# Patient Record
Sex: Male | Born: 1960 | Race: White | Hispanic: Yes | Smoking: Never smoker
Health system: Southern US, Community
[De-identification: ages and names within clinical notes are randomized; demographics above are authoritative.]

## PROBLEM LIST (undated history)

## (undated) HISTORY — PX: OTHER SURGICAL HISTORY: SHX169

## (undated) HISTORY — PX: EYE MUSCLE SURGERY: SHX370

---

## 2001-06-11 ENCOUNTER — Emergency Department (HOSPITAL_COMMUNITY): Admission: EM | Admit: 2001-06-11 | Discharge: 2001-06-11 | Payer: Self-pay | Admitting: Emergency Medicine

## 2001-06-17 ENCOUNTER — Emergency Department (HOSPITAL_COMMUNITY): Admission: EM | Admit: 2001-06-17 | Discharge: 2001-06-17 | Payer: Self-pay

## 2005-08-08 ENCOUNTER — Encounter: Admission: RE | Admit: 2005-08-08 | Discharge: 2005-08-08 | Payer: Self-pay | Admitting: Orthopedic Surgery

## 2006-01-03 ENCOUNTER — Emergency Department (HOSPITAL_COMMUNITY): Admission: EM | Admit: 2006-01-03 | Discharge: 2006-01-03 | Payer: Self-pay | Admitting: Emergency Medicine

## 2007-08-31 ENCOUNTER — Emergency Department (HOSPITAL_COMMUNITY): Admission: EM | Admit: 2007-08-31 | Discharge: 2007-08-31 | Payer: Self-pay | Admitting: Emergency Medicine

## 2015-07-02 ENCOUNTER — Encounter (HOSPITAL_COMMUNITY): Payer: Self-pay | Admitting: Vascular Surgery

## 2015-07-02 DIAGNOSIS — K5900 Constipation, unspecified: Secondary | ICD-10-CM | POA: Insufficient documentation

## 2015-07-02 DIAGNOSIS — R110 Nausea: Secondary | ICD-10-CM | POA: Insufficient documentation

## 2015-07-02 LAB — COMPREHENSIVE METABOLIC PANEL
ALBUMIN: 4.1 g/dL (ref 3.5–5.0)
ALK PHOS: 54 U/L (ref 38–126)
ALT: 96 U/L — ABNORMAL HIGH (ref 17–63)
AST: 95 U/L — AB (ref 15–41)
Anion gap: 10 (ref 5–15)
BILIRUBIN TOTAL: 1.4 mg/dL — AB (ref 0.3–1.2)
BUN: 9 mg/dL (ref 6–20)
CALCIUM: 9.5 mg/dL (ref 8.9–10.3)
CO2: 26 mmol/L (ref 22–32)
CREATININE: 0.81 mg/dL (ref 0.61–1.24)
Chloride: 103 mmol/L (ref 101–111)
GFR calc Af Amer: >60 mL/min (ref >60)
GFR calc non Af Amer: >60 mL/min (ref >60)
GLUCOSE: 117 mg/dL — AB (ref 65–99)
Potassium: 3.6 mmol/L (ref 3.5–5.1)
Sodium: 139 mmol/L (ref 135–145)
TOTAL PROTEIN: 6.8 g/dL (ref 6.5–8.1)

## 2015-07-02 LAB — CBC
HCT: 44 % (ref 39.0–52.0)
Hemoglobin: 15.2 g/dL (ref 13.0–17.0)
MCH: 30.3 pg (ref 26.0–34.0)
MCHC: 34.5 g/dL (ref 30.0–36.0)
MCV: 87.6 fL (ref 78.0–100.0)
Platelets: 202 10*3/uL (ref 150–400)
RBC: 5.02 MIL/uL (ref 4.22–5.81)
RDW: 12.5 % (ref 11.5–15.5)
WBC: 7.4 10*3/uL (ref 4.0–10.5)

## 2015-07-02 LAB — TYPE AND SCREEN
ABO/RH(D): B POS
ANTIBODY SCREEN: NEGATIVE

## 2015-07-02 LAB — ABO/RH: ABO/RH(D): B POS

## 2015-07-02 NOTE — ED Notes (Signed)
Pt reports to the ED for eval of abd pain and constipation. Pt reports he has had the abd pain x 3 week. He had a BM today but reports it was small, hard, and black. Pt reports nausea but denies any active vomiting. Pt A&Ox4, resp e/u, and skin warm and dry.

## 2015-07-03 ENCOUNTER — Emergency Department (HOSPITAL_COMMUNITY): Payer: Self-pay

## 2015-07-03 ENCOUNTER — Emergency Department (HOSPITAL_COMMUNITY)
Admission: EM | Admit: 2015-07-03 | Discharge: 2015-07-03 | Disposition: A | Discharge status: Home / Self Care | Payer: Self-pay | Attending: Emergency Medicine | Admitting: Emergency Medicine

## 2015-07-03 ENCOUNTER — Encounter (HOSPITAL_COMMUNITY): Payer: Self-pay | Admitting: Radiology

## 2015-07-03 DIAGNOSIS — K5900 Constipation, unspecified: Secondary | ICD-10-CM

## 2015-07-03 DIAGNOSIS — R109 Unspecified abdominal pain: Secondary | ICD-10-CM

## 2015-07-03 LAB — URINALYSIS, ROUTINE W REFLEX MICROSCOPIC
BILIRUBIN URINE: NEGATIVE
Glucose, UA: NEGATIVE mg/dL
Hgb urine dipstick: NEGATIVE
Ketones, ur: NEGATIVE mg/dL
LEUKOCYTES UA: NEGATIVE
NITRITE: NEGATIVE
PH: 6 (ref 5.0–8.0)
Protein, ur: NEGATIVE mg/dL
SPECIFIC GRAVITY, URINE: 1.004 — AB (ref 1.005–1.030)

## 2015-07-03 MED ORDER — IOHEXOL 300 MG/ML  SOLN
80.0000 mL | Freq: Once | INTRAMUSCULAR | Status: AC | PRN
  Administered 2015-07-03: 100 mL via INTRAVENOUS

## 2015-07-03 MED ORDER — SENNOSIDES-DOCUSATE SODIUM 8.6-50 MG PO TABS: 2.0000 | ORAL_TABLET | Freq: Two times a day (BID) | ORAL | Status: DC

## 2015-07-03 MED ORDER — MORPHINE SULFATE (PF) 4 MG/ML IV SOLN
4.0000 mg | Freq: Once | INTRAVENOUS | Status: AC
  Administered 2015-07-03: 4 mg via INTRAVENOUS
  Filled 2015-07-03: qty 1

## 2015-07-03 MED ORDER — SODIUM CHLORIDE 0.9 % IV BOLUS (SEPSIS)
1000.0000 mL | Freq: Once | INTRAVENOUS | Status: AC
  Administered 2015-07-03: 1000 mL via INTRAVENOUS

## 2015-07-03 NOTE — ED Provider Notes (Addendum)
CSN: [id number]     Arrival date & time 07/02/15  2038 History  By signing my name below, I, Brent Horne, attest that this documentation has been prepared under the direction and in the presence of Azalia BilisKevin Aiyden Lauderback, MD. Electronically Signed: Doreatha MartinEva Horne, ED Scribe. 07/03/2015. 4:06 AM.    Chief Complaint  Patient presents with  . Abdominal Pain   The history is provided by the patient. A language interpreter was used.   HPI Comments: Burke KeelsDamian Horne is a 55 y.o. male who presents to the Emergency Department complaining of moderate lower abdominal pain onset 3 weeks ago with associated nausea, constipation, abdominal distension. He denies emesis.  Reports she's tried twice a day MiraLAX without improvement in his symptoms.  No prior history of colonoscopy.  He denies blood or black colored stool.  No fevers or chills.  History reviewed. No pertinent past medical history. Past Surgical History  Procedure Laterality Date  . Arm surgery    . Eye muscle surgery     No family history on file. Social History  Substance Use Topics  . Smoking status: Never Smoker   . Smokeless tobacco: Never Used  . Alcohol Use: Yes     Comment: weekly    Review of Systems A complete 10 system review of systems was obtained and all systems are negative except as noted in the HPI and PMH.    Allergies  Review of patient's allergies indicates not on file.  Home Medications   Prior to Admission medications   Not on File   BP 102/69 mmHg  Pulse 65  Temp(Src) 97.7 F (36.5 C) (Oral)  Resp 18  SpO2 100% Physical Exam  Constitutional: He is oriented to person, place, and time. He appears well-developed and well-nourished.  HENT:  Head: Normocephalic and atraumatic.  Eyes: EOM are normal.  Neck: Normal range of motion.  Cardiovascular: Normal rate, regular rhythm and normal heart sounds.   Pulmonary/Chest: Effort normal and breath sounds normal. No respiratory distress.  Abdominal: Soft. He  exhibits no distension. There is no tenderness.  Musculoskeletal: Normal range of motion.  Neurological: He is alert and oriented to person, place, and time.  Skin: Skin is warm and dry.  Psychiatric: He has a normal mood and affect. Judgment normal.  Nursing note and vitals reviewed.   ED Course  Procedures (including critical care time) DIAGNOSTIC STUDIES: Oxygen Saturation is 100% on RA, normal by my interpretation.    COORDINATION OF CARE: 1:59 AM Discussed treatment plan with pt at bedside which includes lab work and pt agreed to plan.   Labs Review Labs Reviewed  COMPREHENSIVE METABOLIC PANEL - Abnormal; Notable for the following:    Glucose, Bld 117 (*)    AST 95 (*)    ALT 96 (*)    Total Bilirubin 1.4 (*)    All other components within normal limits  URINALYSIS, ROUTINE W REFLEX MICROSCOPIC (NOT AT [facility]) - Abnormal; Notable for the following:    Specific Gravity, Urine 1.004 (*)    All other components within normal limits  CBC  TYPE AND SCREEN  ABO/RH    Imaging Review Ct Abdomen Pelvis W Contrast  07/03/2015  CLINICAL DATA:  Periumbilical abdominal pain and nausea EXAM: CT ABDOMEN AND PELVIS WITH CONTRAST TECHNIQUE: Multidetector CT imaging of the abdomen and pelvis was performed using the standard protocol following bolus administration of intravenous contrast. CONTRAST:  100mL OMNIPAQUE IOHEXOL 300 MG/ML  SOLN COMPARISON:  12/13/2012 FINDINGS: Lower chest and abdominal  wall: Chronic small fatty umbilical hernia 14 mm nodular density along the right lower lobe tracheal bronchial tree, likely adenopathy but incompletely visualized and volume averaging of vessel cannot be excluded. Hepatobiliary: No focal liver abnormality.No evidence of biliary obstruction or stone. Pancreas: Unremarkable. Spleen: Unremarkable. Adrenals/Urinary Tract: Negative adrenals. No hydronephrosis or stone. Unremarkable bladder. Reproductive:No pathologic findings. Stomach/Bowel: Stool distends  most colonic segments. No bowel obstruction or impaction. No appendicitis. Vascular/Lymphatic: No acute vascular abnormality. No mass or adenopathy. Peritoneal: No ascites or pneumoperitoneum. Musculoskeletal: No acute abnormalities. IMPRESSION: 1. Constipation without obstruction or impaction. 2. Probable right lower lobe bronchial adenopathy, certainty limited by partial visualization. Recommend nonemergent chest CT follow up. Electronically Signed   By: Marnee Spring M.D.   On: 07/03/2015 03:24   I have personally reviewed and evaluated these images and lab results as part of my medical decision-making.   MDM   Final diagnoses:  Abdominal pain, unspecified abdominal location  Constipation, unspecified constipation type    Postvoid residual is normal.  Patient be placed on Senokot in addition to MiraLAX.  Of encouraged high fiber diet and ongoing hydration with water  I personally performed the services described in this documentation, which was scribed in my presence. The recorded information has been reviewed and is accurate.  Patient was told that he will need follow-up CT scan of his chest to further evaluate the abnormal right lower lobe bronchial adenopathy.  This information was transferred to him with use of the interpreter     Azalia Bilis, MD 07/04/15 1478  Azalia Bilis, MD 07/04/15 [phone]

## 2015-07-03 NOTE — Discharge Instructions (Signed)
Estreimiento - Adultos (Constipation, Adult) Estreimiento significa que una persona tiene menos de tres evacuaciones en una semana, dificultad para defecar, o que las heces son secas, duras, o ms grandes que lo normal. A medida que envejecemos el estreimiento es ms comn. Una dieta baja en fibra, no tomar suficientes lquidos y el uso de ciertos medicamentos pueden empeorar el estreimiento.  CAUSAS   Ciertos medicamentos, como los antidepresivos, analgsicos, suplementos de hierro, anticidos y diurticos.  Algunas enfermedades, como la diabetes, el sndrome del colon irritable, enfermedad de la tiroides, o depresin.  No beber suficiente agua.  No consumir suficientes alimentos ricos en fibra.  Situaciones de estrs o viajes.  Falta de actividad fsica o de ejercicio.  Ignorar la necesidad sbita de defecar.  Uso en exceso de laxantes. SIGNOS Y SNTOMAS   Defecar menos de tres veces por semana.  Dificultad para defecar.  Tener las heces secas y duras, o ms grandes que las normales.  Sensacin de estar lleno o hinchado.  Dolor en la parte baja del abdomen.  No sentir alivio despus de defecar. DIAGNSTICO  El mdico le har una historia clnica y un examen fsico. Pueden hacerle exmenes adicionales para el estreimiento grave. Estos estudios pueden ser:  Un radiografa con enema de bario para examinar el recto, el colon y, en algunos casos, el intestino delgado.  Una sigmoidoscopia para examinar el colon inferior.  Una colonoscopia para examinar todo el colon. TRATAMIENTO  El tratamiento depender de la gravedad del estreimiento y de la causa. Algunos tratamientos nutricionales son beber ms lquidos y comer ms alimentos ricos en fibra. El cambio en el estilo de vida incluye hacer ejercicios de manera regular. Si estas recomendaciones para realizar cambios en la dieta y en el estilo de vida no ayudan, el mdico le puede indicar el uso de laxantes de venta libre  para ayudarlo a defecar. Los medicamentos recetados se pueden prescribir si los medicamentos de venta libre no lo ayudan.  INSTRUCCIONES PARA EL CUIDADO EN EL HOGAR   Consuma alimentos con alto contenido de fibra, como frutas, vegetales, cereales integrales y porotos.  Limite los alimentos procesados ricos en grasas y azcar, como las papas fritas, hamburguesas, galletas, dulces y refrescos.  Puede agregar un suplemento de fibra a su dieta si no obtiene lo suficiente de los alimentos.  Beba suficiente lquido para mantener la orina clara o de color amarillo plido.  Haga ejercicio regularmente o segn las indicaciones del mdico.  Vaya al bao cuando sienta la necesidad de ir. No se aguante las ganas.  Tome solo medicamentos de venta libre o recetados, segn las indicaciones del mdico. No tome otros medicamentos para el estreimiento sin consultarlo antes con su mdico. SOLICITE ATENCIN MDICA DE INMEDIATO SI:   Observa sangre brillante en las heces.  El estreimiento dura ms de 4 das o empeora.  Siente dolor abdominal o rectal.  Las heces son delgadas como un lpiz.  Pierde peso de manera inexplicable. ASEGRESE DE QUE:   Comprende estas instrucciones.  Controlar su afeccin.  Recibir ayuda de inmediato si no mejora o si empeora.   Esta informacin no tiene como fin reemplazar el consejo del mdico. Asegrese de hacerle al mdico cualquier pregunta que tenga.   Document Released: 04/30/2007 Document Revised: 05/01/2014 Elsevier Interactive Patient Education 2016 [employer]  

## 2015-07-03 NOTE — ED Notes (Signed)
Given Spanish Education on high fiber foods.

## 2015-07-03 NOTE — ED Notes (Signed)
Patient transported to CT scan . 

## 2016-02-21 ENCOUNTER — Encounter (HOSPITAL_COMMUNITY): Payer: Self-pay | Admitting: *Deleted

## 2016-02-21 DIAGNOSIS — R39198 Other difficulties with micturition: Secondary | ICD-10-CM | POA: Insufficient documentation

## 2016-02-21 DIAGNOSIS — K5900 Constipation, unspecified: Secondary | ICD-10-CM | POA: Insufficient documentation

## 2016-02-21 DIAGNOSIS — R1030 Lower abdominal pain, unspecified: Secondary | ICD-10-CM | POA: Diagnosis present

## 2016-02-21 LAB — CBC
HCT: 43.6 % (ref 39.0–52.0)
Hemoglobin: 15.4 g/dL (ref 13.0–17.0)
MCH: 31 pg (ref 26.0–34.0)
MCHC: 35.3 g/dL (ref 30.0–36.0)
MCV: 87.9 fL (ref 78.0–100.0)
PLATELETS: 222 10*3/uL (ref 150–400)
RBC: 4.96 MIL/uL (ref 4.22–5.81)
RDW: 12.2 % (ref 11.5–15.5)
WBC: 11 10*3/uL — ABNORMAL HIGH (ref 4.0–10.5)

## 2016-02-21 LAB — URINALYSIS, ROUTINE W REFLEX MICROSCOPIC
BILIRUBIN URINE: NEGATIVE
Glucose, UA: NEGATIVE mg/dL
KETONES UR: NEGATIVE mg/dL
Leukocytes, UA: NEGATIVE
Nitrite: NEGATIVE
PROTEIN: NEGATIVE mg/dL
Specific Gravity, Urine: 1.018 (ref 1.005–1.030)
pH: 6 (ref 5.0–8.0)

## 2016-02-21 LAB — COMPREHENSIVE METABOLIC PANEL
ALT: 33 U/L (ref 17–63)
AST: 32 U/L (ref 15–41)
Albumin: 4.5 g/dL (ref 3.5–5.0)
Alkaline Phosphatase: 54 U/L (ref 38–126)
Anion gap: 7 (ref 5–15)
BUN: 17 mg/dL (ref 6–20)
CALCIUM: 9.6 mg/dL (ref 8.9–10.3)
CO2: 26 mmol/L (ref 22–32)
CREATININE: 0.97 mg/dL (ref 0.61–1.24)
Chloride: 105 mmol/L (ref 101–111)
Glucose, Bld: 109 mg/dL — ABNORMAL HIGH (ref 65–99)
Potassium: 3.9 mmol/L (ref 3.5–5.1)
Sodium: 138 mmol/L (ref 135–145)
Total Bilirubin: 1 mg/dL (ref 0.3–1.2)
Total Protein: 7.5 g/dL (ref 6.5–8.1)

## 2016-02-21 LAB — URINE MICROSCOPIC-ADD ON
SQUAMOUS EPITHELIAL / LPF: NONE SEEN
WBC UA: NONE SEEN WBC/hpf (ref 0–5)

## 2016-02-21 LAB — LIPASE, BLOOD: Lipase: 30 U/L (ref 11–51)

## 2016-02-21 NOTE — ED Triage Notes (Signed)
Pt states lower back pain, lower abdominal pain and difficulty urinating x 2 weeks, accompanied by nausea.  Interpreter services used.

## 2016-02-21 NOTE — ED Notes (Signed)
Interpreter services used

## 2016-02-22 ENCOUNTER — Emergency Department (HOSPITAL_COMMUNITY): Payer: No Typology Code available for payment source

## 2016-02-22 ENCOUNTER — Emergency Department (HOSPITAL_COMMUNITY)
Admission: EM | Admit: 2016-02-22 | Discharge: 2016-02-22 | Disposition: A | Discharge status: Home / Self Care | Payer: No Typology Code available for payment source | Attending: Emergency Medicine | Admitting: Emergency Medicine

## 2016-02-22 DIAGNOSIS — R39198 Other difficulties with micturition: Secondary | ICD-10-CM

## 2016-02-22 DIAGNOSIS — R1084 Generalized abdominal pain: Secondary | ICD-10-CM

## 2016-02-22 DIAGNOSIS — K5900 Constipation, unspecified: Secondary | ICD-10-CM

## 2016-02-22 MED ORDER — TAMSULOSIN HCL 0.4 MG PO CAPS: 0.4000 mg | ORAL_CAPSULE | Freq: Every day | ORAL | 0 refills | Status: DC

## 2016-02-22 MED ORDER — POLYETHYLENE GLYCOL 3350 17 G PO PACK: 17.0000 g | PACK | Freq: Two times a day (BID) | ORAL | 0 refills | Status: DC

## 2016-02-22 MED ORDER — IOPAMIDOL (ISOVUE-370) INJECTION 76%
INTRAVENOUS | Status: AC
  Administered 2016-02-22: 100 mL
  Filled 2016-02-22: qty 100

## 2016-02-22 MED ORDER — MORPHINE SULFATE (PF) 4 MG/ML IV SOLN
4.0000 mg | Freq: Once | INTRAVENOUS | Status: AC
  Administered 2016-02-22: 4 mg via INTRAVENOUS
  Filled 2016-02-22: qty 1

## 2016-02-22 NOTE — ED Notes (Signed)
Pt given saltines and ginger ale. 

## 2016-02-22 NOTE — ED Notes (Signed)
Pt returned from CT °

## 2016-02-22 NOTE — Discharge Instructions (Signed)
You were seen today for abdominal pain.  Your work-up is reassuring.  YOu will be sent home with miralax for constipation and flomax for difficulty urinating.

## 2016-02-22 NOTE — ED Provider Notes (Signed)
[facility]-EMERGENCY DEPT Provider Note   CSN: [id number] Arrival date & time: 02/21/16  1631   By signing my name below, I, Brent Horne, attest that this documentation has been prepared under the direction and in the presence of Brent Batonourtney F Vi Whitesel, MD  Electronically Signed: Clovis PuAvnee Horne, ED Scribe. 02/22/16. 1:18 AM.   History   Chief Complaint Chief Complaint  Patient presents with  . Back Pain  . Abdominal Pain   The history is provided by the patient. A language interpreter was used.   HPI Comments:  Brent Horne is a 55 y.o. male who presents to the Emergency Department complaining of worsening "7//10"  lower abdominal pain x 2 weeks. No exacerbating or alleviating factors noted. Associated symptoms include lower back pain x today. He also notes constipation and states his last normal bowel movement was 2 weeks ago. Pt took unknown medication in an orange bottle for constipation with no relief. Was difficult to urinating. Denies dysuria or hematuria. Pt denies weakness to BLE,/BUE and nausea. He also denies a hx of heart disease, HTN, DM, and taking daily medications. Pt denies any other symptoms or complaints at this time.  History reviewed. No pertinent past medical history.  There are no active problems to display for this patient.   Past Surgical History:  Procedure Laterality Date  . arm surgery    . EYE MUSCLE SURGERY      Home Medications    Prior to Admission medications   Medication Sig Start Date End Date Taking? Authorizing Provider  indomethacin (INDOCIN) 25 MG capsule Take 25 mg by mouth 3 (three) times daily as needed for mild pain.   Yes Historical Provider, MD  polyethylene glycol (MIRALAX / GLYCOLAX) packet Take 17 g by mouth 2 (two) times daily. 02/22/16   Brent Batonourtney F Brent Polidori, MD  tamsulosin (FLOMAX) 0.4 MG CAPS capsule Take 1 capsule (0.4 mg total) by mouth daily. 02/22/16   Brent Batonourtney F Brent Dimperio, MD    Family History No family history on  file.  Social History Social History  Substance Use Topics  . Smoking status: Never Smoker  . Smokeless tobacco: Never Used  . Alcohol use Yes     Comment: weekly     Allergies   Review of patient's allergies indicates no known allergies.   Review of Systems Review of Systems  Constitutional: Negative for fever.  Respiratory: Negative for shortness of breath.   Cardiovascular: Negative for chest pain.  Gastrointestinal: Positive for abdominal pain and constipation. Negative for nausea.  Musculoskeletal: Positive for back pain.  Neurological: Negative for weakness.  All other systems reviewed and are negative.    Physical Exam Updated Vital Signs BP 120/80   Pulse 75   Temp 98.5 F (36.9 C) (Oral)   Resp 16   Ht 5\' 8"  (1.727 m)   Wt 150 lb (68 kg)   SpO2 98%   BMI 22.81 kg/m   Physical Exam  Constitutional: He is oriented to person, place, and time. He appears well-developed and well-nourished. No distress.  HENT:  Head: Normocephalic and atraumatic.  Cardiovascular: Normal rate, regular rhythm and normal heart sounds.   No murmur heard. Pulmonary/Chest: Effort normal and breath sounds normal. No respiratory distress. He has no wheezes.  Abdominal: Soft. Bowel sounds are normal. There is tenderness. There is no rebound and no guarding.  Left upper quadrant tenderness on palpation without rebound or guarding  Musculoskeletal: He exhibits no edema.  Neurological: He is alert and oriented to person, place,  and time.  Skin: Skin is warm and dry.  Psychiatric: He has a normal mood and affect.  Nursing note and vitals reviewed.    ED Treatments / Results  DIAGNOSTIC STUDIES:  Oxygen Saturation is 99% on RA, normal by my interpretation.    COORDINATION OF CARE:  1:05 AM Discussed treatment plan with pt at bedside and pt agreed to plan.  Labs (all labs ordered are listed, but only abnormal results are displayed) Labs Reviewed  COMPREHENSIVE METABOLIC PANEL  - Abnormal; Notable for the following:       Result Value   Glucose, Bld 109 (*)    All other components within normal limits  CBC - Abnormal; Notable for the following:    WBC 11.0 (*)    All other components within normal limits  URINALYSIS, ROUTINE W REFLEX MICROSCOPIC (NOT AT [facility]) - Abnormal; Notable for the following:    Hgb urine dipstick TRACE (*)    All other components within normal limits  URINE MICROSCOPIC-ADD ON - Abnormal; Notable for the following:    Bacteria, UA RARE (*)    All other components within normal limits  LIPASE, BLOOD    EKG  EKG Interpretation None       Radiology Ct Angio Abd/pel W And/or Wo Contrast  Result Date: 02/22/2016 CLINICAL DATA:  Subacute onset of lower back and lower abdominal pain. Initial encounter. EXAM: CTA ABDOMEN AND PELVIS WITHOUT AND WITH CONTRAST TECHNIQUE: Multidetector CT imaging of the abdomen and pelvis was performed using the standard protocol during bolus administration of intravenous contrast. Multiplanar reconstructed images and MIPs were obtained and reviewed to evaluate the vascular anatomy. CONTRAST:  100 mL of Isovue 370 IV contrast COMPARISON:  CT of the abdomen and pelvis from 07/03/2015 FINDINGS: VASCULAR Aorta: There is no evidence of aortic dissection. There is no evidence of aneurysmal dilatation. No calcific atherosclerotic disease seen. The abdominal aorta is grossly unremarkable in appearance. Celiac: The celiac trunk remains fully patent. SMA: The superior mesenteric artery is fully patent. Renals: The renal arteries appear fully patent bilaterally. IMA: The inferior mesenteric artery is fully patent. Inflow: The common, external and internal iliac arteries appear fully patent bilaterally. Proximal Outflow: The common femoral arteries and their proximal branches are unremarkable in appearance. Veins: The visualized venous structures are unremarkable. The inferior vena cava is within normal limits. Review of the MIP  images confirms the above findings. NON-VASCULAR Lower chest: Minimal bibasilar atelectasis is noted. Hepatobiliary: The liver is unremarkable in appearance. The gallbladder is unremarkable in appearance. The common bile duct remains normal in caliber. Pancreas: The pancreas is within normal limits. Spleen: The spleen is unremarkable in appearance. Adrenals/Urinary Tract: The adrenal glands are unremarkable in appearance. The kidneys are within normal limits. There is no evidence of hydronephrosis. No renal or ureteral stones are identified. No perinephric stranding is seen. Stomach/Bowel: The stomach is unremarkable in appearance. The small bowel is within normal limits. The appendix is normal in caliber, without evidence of appendicitis. The colon is unremarkable in appearance. Lymphatic: No retroperitoneal lymphadenopathy is seen. No pelvic sidewall lymphadenopathy is identified. Reproductive: The bladder is mildly distended and grossly unremarkable. The prostate remains normal in size. Other: No additional soft tissue abnormalities are seen. Musculoskeletal: No acute osseous abnormalities are identified. The visualized musculature is unremarkable in appearance. IMPRESSION: VASCULAR No evidence of aortic dissection. No evidence of aneurysmal dilatation. No calcific atherosclerotic disease seen. NON-VASCULAR No acute abnormality seen within the abdomen or pelvis. Electronically Signed  By: Roanna RaiderJeffery  Chang M.D.   On: 02/22/2016 02:57    Procedures Procedures (including critical care time)  Medications Ordered in ED Medications  morphine 4 MG/ML injection 4 mg (4 mg Intravenous Given 02/22/16 0131)  iopamidol (ISOVUE-370) 76 % injection (100 mLs  Contrast Given 02/22/16 0202)     Initial Impression / Assessment and Plan / ED Course  I have reviewed the triage vital signs and the nursing notes.  Pertinent labs & imaging results that were available during my care of the patient were reviewed by me and  considered in my medical decision making (see chart for details).  Clinical Course    Patient presents with abdominal pain. Also reports constipation and back pain. Nontoxic. Nonfocal. Vital signs reassuring. Tender without signs of peritonitis. Considerations include constipation, AAA, urinary tract infection. Urinalysis normal. Basic labwork reassuring. CT angiogram the abdomen is negative for AAA. Will treat for constipation with MiraLAX. Will also provide Flomax for difficulty urinating. Follow-up with urology if not improved.  After history, exam, and medical workup I feel the patient has been appropriately medically screened and is safe for discharge home. Pertinent diagnoses were discussed with the patient. Patient was given return precautions.   Final Clinical Impressions(s) / ED Diagnoses   Final diagnoses:  Generalized abdominal pain  Constipation, unspecified constipation type  Difficulty urinating    New Prescriptions New Prescriptions   POLYETHYLENE GLYCOL (MIRALAX / GLYCOLAX) PACKET    Take 17 g by mouth 2 (two) times daily.   TAMSULOSIN (FLOMAX) 0.4 MG CAPS CAPSULE    Take 1 capsule (0.4 mg total) by mouth daily.   I personally performed the services described in this documentation, which was scribed in my presence. The recorded information has been reviewed and is accurate.     Brent Batonourtney F Shuntia Exton, MD 02/22/16 [phone]

## 2017-02-27 ENCOUNTER — Encounter (HOSPITAL_COMMUNITY): Payer: Self-pay | Admitting: Emergency Medicine

## 2017-02-27 ENCOUNTER — Emergency Department (HOSPITAL_COMMUNITY)
Admission: EM | Admit: 2017-02-27 | Discharge: 2017-02-27 | Disposition: A | Discharge status: Home / Self Care | Payer: No Typology Code available for payment source | Attending: Emergency Medicine | Admitting: Emergency Medicine

## 2017-02-27 ENCOUNTER — Emergency Department (HOSPITAL_COMMUNITY): Payer: No Typology Code available for payment source

## 2017-02-27 DIAGNOSIS — N400 Enlarged prostate without lower urinary tract symptoms: Secondary | ICD-10-CM | POA: Diagnosis not present

## 2017-02-27 DIAGNOSIS — R109 Unspecified abdominal pain: Secondary | ICD-10-CM | POA: Diagnosis present

## 2017-02-27 DIAGNOSIS — K700 Alcoholic fatty liver: Secondary | ICD-10-CM | POA: Insufficient documentation

## 2017-02-27 DIAGNOSIS — K760 Fatty (change of) liver, not elsewhere classified: Secondary | ICD-10-CM

## 2017-02-27 DIAGNOSIS — Z79899 Other long term (current) drug therapy: Secondary | ICD-10-CM | POA: Insufficient documentation

## 2017-02-27 LAB — URINALYSIS, ROUTINE W REFLEX MICROSCOPIC
Bacteria, UA: NONE SEEN
Bilirubin Urine: NEGATIVE
Glucose, UA: NEGATIVE mg/dL
Ketones, ur: 5 mg/dL — AB
Leukocytes, UA: NEGATIVE
Nitrite: NEGATIVE
Protein, ur: 30 mg/dL — AB
Specific Gravity, Urine: 1.016 (ref 1.005–1.030)
Squamous Epithelial / LPF: NONE SEEN
pH: 6 (ref 5.0–8.0)

## 2017-02-27 LAB — COMPREHENSIVE METABOLIC PANEL
ALT: 32 U/L (ref 17–63)
AST: 36 U/L (ref 15–41)
Albumin: 4.2 g/dL (ref 3.5–5.0)
Alkaline Phosphatase: 54 U/L (ref 38–126)
Anion gap: 11 (ref 5–15)
BUN: 16 mg/dL (ref 6–20)
CO2: 24 mmol/L (ref 22–32)
Calcium: 9.7 mg/dL (ref 8.9–10.3)
Chloride: 105 mmol/L (ref 101–111)
Creatinine, Ser: 0.8 mg/dL (ref 0.61–1.24)
GFR calc Af Amer: >60 mL/min (ref >60)
GFR calc non Af Amer: >60 mL/min (ref >60)
Glucose, Bld: 103 mg/dL — ABNORMAL HIGH (ref 65–99)
Potassium: 3.9 mmol/L (ref 3.5–5.1)
Sodium: 140 mmol/L (ref 135–145)
Total Bilirubin: 0.7 mg/dL (ref 0.3–1.2)
Total Protein: 7.6 g/dL (ref 6.5–8.1)

## 2017-02-27 LAB — CBC
HCT: 43.3 % (ref 39.0–52.0)
Hemoglobin: 15.2 g/dL (ref 13.0–17.0)
MCH: 31.3 pg (ref 26.0–34.0)
MCHC: 35.1 g/dL (ref 30.0–36.0)
MCV: 89.1 fL (ref 78.0–100.0)
Platelets: 197 10*3/uL (ref 150–400)
RBC: 4.86 MIL/uL (ref 4.22–5.81)
RDW: 12.7 % (ref 11.5–15.5)
WBC: 8 10*3/uL (ref 4.0–10.5)

## 2017-02-27 LAB — LIPASE, BLOOD: Lipase: 28 U/L (ref 11–51)

## 2017-02-27 MED ORDER — IOPAMIDOL (ISOVUE-300) INJECTION 61%
INTRAVENOUS | Status: AC
  Administered 2017-02-27: 100 mL
  Filled 2017-02-27: qty 100

## 2017-02-27 NOTE — ED Notes (Addendum)
ED Provider at bedside.  Reviewed results and discharge instructions using an interpertor.

## 2017-02-27 NOTE — ED Triage Notes (Signed)
Translator/pt stated, I've had abdominal pain with back pain for over a year with some burning with peeing.Over a year.

## 2017-02-27 NOTE — Discharge Instructions (Signed)
Avoid alcohol. Try maalox or mylanta for stomach pain. Follow up with family doctor for further evaluation and treatment of your enlarged prostate and liver inflammation.

## 2017-02-27 NOTE — ED Provider Notes (Signed)
[facility] EMERGENCY [facility]PARTMENT Provider Note   CSN: [id number] Arrival date & time: 02/27/17  1754     History   Chief Complaint Chief Complaint  Patient presents with  . Abdominal Pain  . Back Pain  . Dysuria    HPI Brent Horne is a 56 y.o. male.  HPI Brent Horne is a 56 y.o. male with no medical problems, presents to emergency department complaining of abdominal pain.  Patient states he has had abdominal pain for approximately 2 weeks, however states it started to get worse in the last 2 days.  He reports associated nausea, no vomiting.  Reports normal bowel movements, last bowel movement was this morning.  Denies any blood in his stool.  States he is eating and drinking normally.  States pain is mainly in the left lower abdomen but radiates all over.  Denies any associated fever or chills.  He does report some burning with urination, however no frequency or urgency.  He has not been taking any medications to help with this.  His pain is worsened with any movement. No hx of the same  Pt is spanish speaking only, interpreter was used.   History reviewed. No pertinent past medical history.  There are no active problems to display for this patient.   Past Surgical History:  Procedure Laterality Date  . arm surgery    . EYE MUSCLE SURGERY         Home Medications    Prior to Admission medications   Medication Sig Start Date End Date Taking? Authorizing Provider  indomethacin (INDOCIN) 25 MG capsule Take 25 mg by mouth 3 (three) times daily as needed for mild pain.    [provider]  polyethylene glycol (MIRALAX / GLYCOLAX) packet Take 17 g by mouth 2 (two) times daily. 02/22/16   Horton, Mayer Maskerourtney F, MD  tamsulosin (FLOMAX) 0.4 MG CAPS capsule Take 1 capsule (0.4 mg total) by mouth daily. 02/22/16   Horton, Mayer Maskerourtney F, MD    Family History No family history on file.  Social History Social History   Tobacco Use  .  Smoking status: Never Smoker  . Smokeless tobacco: Never Used  Substance Use Topics  . Alcohol use: Yes    Comment: weekly  . Drug use: No     Allergies   Patient has no known allergies.   Review of Systems Review of Systems  Constitutional: Negative for chills and fever.  Respiratory: Negative for cough, chest tightness and shortness of breath.   Cardiovascular: Negative for chest pain, palpitations and leg swelling.  Gastrointestinal: Positive for abdominal pain and nausea. Negative for abdominal distention, diarrhea and vomiting.  Genitourinary: Positive for dysuria. Negative for discharge, flank pain, frequency, hematuria, scrotal swelling, testicular pain and urgency.  Musculoskeletal: Negative for arthralgias, myalgias, neck pain and neck stiffness.  Skin: Negative for rash.  Allergic/Immunologic: Negative for immunocompromised state.  Neurological: Negative for dizziness, weakness, light-headedness, numbness and headaches.  All other systems reviewed and are negative.    Physical Exam Updated Vital Signs BP (!) 145/99 (BP Location: Left Arm)   Pulse 77   Temp 97.8 F (36.6 C) (Oral)   Resp 16   Wt 74.8 kg (165 lb)   SpO2 99%   BMI 25.09 kg/m   Physical Exam  Constitutional: He appears well-developed and well-nourished. No distress.  HENT:  Head: Normocephalic and atraumatic.  Eyes: Conjunctivae are normal.  Neck: Neck supple.  Cardiovascular: Normal rate, regular rhythm and normal heart  sounds.  Pulmonary/Chest: Effort normal. No respiratory distress. He has no wheezes. He has no rales.  Abdominal: Soft. Bowel sounds are normal. He exhibits no distension. There is generalized tenderness. There is no rigidity, no rebound and no tenderness at McBurney's point.  Diffuse tenderness, worse in the left lower quadrant.  Some voluntary guarding  Musculoskeletal: He exhibits no edema.  Neurological: He is alert.  Skin: Skin is warm and dry.  Nursing note and vitals  reviewed.    ED Treatments / Results  Labs (all labs ordered are listed, but only abnormal results are displayed) Labs Reviewed  URINALYSIS, ROUTINE W REFLEX MICROSCOPIC - Abnormal; Notable for the following components:      Result Value   Hgb urine dipstick SMALL (*)    Ketones, ur 5 (*)    Protein, ur 30 (*)    All other components within normal limits  CBC  LIPASE, BLOOD  COMPREHENSIVE METABOLIC PANEL    EKG  EKG Interpretation None       Radiology Ct Abdomen Pelvis W Contrast  Result Date: 02/27/2017 CLINICAL DATA:  Abdominal pain and back pain for over a year. Burning with urination. EXAM: CT ABDOMEN AND PELVIS WITH CONTRAST TECHNIQUE: Multidetector CT imaging of the abdomen and pelvis was performed using the standard protocol following bolus administration of intravenous contrast. CONTRAST:  ISOVUE-300 IOPAMIDOL (ISOVUE-300) INJECTION 61% COMPARISON:  02/22/2016 FINDINGS: Lower chest: The lung bases are clear. Hepatobiliary: Mild diffuse fatty infiltration of the liver. No focal lesions identified. Gallbladder and bile ducts are unremarkable. Pancreas: Unremarkable. No pancreatic ductal dilatation or surrounding inflammatory changes. Spleen: Normal in size without focal abnormality. Adrenals/Urinary Tract: Adrenal glands are unremarkable. Kidneys are normal, without renal calculi, focal lesion, or hydronephrosis. Bladder is unremarkable. Stomach/Bowel: Stomach is within normal limits. Appendix appears normal. No evidence of bowel wall thickening, distention, or inflammatory changes. Vascular/Lymphatic: No significant vascular findings are present. No enlarged abdominal or pelvic lymph nodes. Reproductive: Prostate gland is mildly enlarged, measuring 4.3 cm diameter. Prostate calcifications. Other: Minimal periumbilical hernia containing fat. No free air or free fluid in the abdomen. Musculoskeletal: Degenerative changes in the spine. No destructive bone lesions. IMPRESSION:  1. Diffuse fatty infiltration of the liver. 2. Enlarged prostate gland. 3. Minimal periumbilical hernia containing fat. 4. No bowel obstruction or inflammation. Electronically Signed   By: Burman Nieves M.D.   On: 02/27/2017 21:15    Procedures Procedures (including critical care time)  Medications Ordered in ED Medications - No data to display   Initial Impression / Assessment and Plan / ED Course  I have reviewed the triage vital signs and the nursing notes.  Pertinent labs & imaging results that were available during my care of the patient were reviewed by me and considered in my medical decision making (see chart for details).     Patient with left lower quadrant abdominal pain, worse with any movement.  Pretty significant tenderness with some guarding.  Labs pending.  Will get CT abdomen pelvis to rule out diverticulitis. UA pending  UA with small hgb, 30 prot, otherwise unremarkable with no signs of infection. CT showing hepatosteatosis and prostate enlargement. No fever. Normal WBC.  No concern for infectious process. Discussed results via interpreter. Will dc home with close outpatient follow up. Return precautions discussed.   Vitals:   02/27/17 1930 02/27/17 1945 02/27/17 2000 02/27/17 2139  BP: (!) 136/94 120/78 116/77 137/89  Pulse: 70 67 61 67  Resp:    18  Temp:  TempSrc:      SpO2: 100% 98% 97% 100%  Weight:        Final Clinical Impressions(s) / ED Diagnoses   Final diagnoses:  Hepatic steatosis  Prostate enlargement    ED Discharge Orders    None       Jaynie CrumbleKirichenko, Lydiann Bonifas, Cordelia Poche-C 02/27/17 2150    Raeford RazorKohut, Stephen, MD 03/06/17 1230

## 2017-02-27 NOTE — ED Notes (Signed)
Signature pad not working, no questions at this time

## 2017-03-05 ENCOUNTER — Ambulatory Visit (HOSPITAL_COMMUNITY)
Admission: EM | Admit: 2017-03-05 | Discharge: 2017-03-05 | Disposition: A | Discharge status: Home / Self Care | Payer: No Typology Code available for payment source | Attending: Emergency Medicine | Admitting: Emergency Medicine

## 2017-03-05 ENCOUNTER — Encounter (HOSPITAL_COMMUNITY): Payer: Self-pay | Admitting: Emergency Medicine

## 2017-03-05 DIAGNOSIS — R1084 Generalized abdominal pain: Secondary | ICD-10-CM | POA: Diagnosis not present

## 2017-03-05 DIAGNOSIS — N400 Enlarged prostate without lower urinary tract symptoms: Secondary | ICD-10-CM | POA: Diagnosis not present

## 2017-03-05 DIAGNOSIS — K5901 Slow transit constipation: Secondary | ICD-10-CM

## 2017-03-05 DIAGNOSIS — K760 Fatty (change of) liver, not elsewhere classified: Secondary | ICD-10-CM

## 2017-03-05 MED ORDER — TAMSULOSIN HCL 0.4 MG PO CAPS: 0.4000 mg | ORAL_CAPSULE | Freq: Every day | ORAL | 0 refills | Status: AC

## 2017-03-05 MED ORDER — POLYETHYLENE GLYCOL 3350 17 GM/SCOOP PO POWD: ORAL | 0 refills | Status: AC

## 2017-03-05 MED ORDER — CIPROFLOXACIN HCL 500 MG PO TABS: 500.0000 mg | ORAL_TABLET | Freq: Two times a day (BID) | ORAL | 0 refills | Status: AC

## 2017-03-05 NOTE — Discharge Instructions (Signed)
Read these instructions Take the medications as directed. Go to pharmacy to get them Call the stomach specialist for appointment Will also need to see a urologist for the prostate

## 2017-03-05 NOTE — ED Provider Notes (Signed)
[facility]    CSN: [id number] Arrival date & time: 03/05/17  1747     History   Chief Complaint Chief Complaint  Patient presents with  . Abdominal Pain    HPI Brent Horne is a 56 y.o. male.   [name] presents to the urgent care twice with 6 days after he was evaluated in the emergency department for abdominal pain and some urinary symptoms. He had a battery of lab work and CT scans of the abdomen and pelvis. The findings were that of some light in the urine, hepatic steatosis and enlargement of the prostate. There was no obstruction, masses or other abnormal findings.  The patient states that he feels that he is constipated. Although he had a bowel movement yesterday he believes that he has been constipated for several days or weeks. The abdominal pain is actually been chronic for 2 years but worse in the past 2 weeks. He occasionally has nausea but no vomiting. No new symptoms today. This is a continuation of symptoms from the emergency department, same history and complaint.    Below his a copy of emergency department notes for the visit on November 6:  Patient with left lower quadrant abdominal pain, worse with any movement.  Pretty significant tenderness with some guarding.  Labs pending.  Will get CT abdomen pelvis to rule out diverticulitis. UA pending  UA with small hgb, 30 prot, otherwise unremarkable with no signs of infection. CT showing hepatosteatosis and prostate enlargement. No fever. Normal WBC.  No concern for infectious process. Discussed results via interpreter. Will dc home with close outpatient follow up. Return precautions discussed.   Vitals:  02/27/17 1930 02/27/17 1945 02/27/17 2000 02/27/17 2139 BP: (!) 136/94 120/78 116/77 137/89 Pulse: 70 67 61 67 Resp:    18 Temp:     TempSrc:     SpO2: 100% 98% 97% 100%        History reviewed. No pertinent past medical history.  There are no active  problems to display for this patient.   Past Surgical History:  Procedure Laterality Date  . arm surgery    . EYE MUSCLE SURGERY         Home Medications    Prior to Admission medications   Medication Sig Start Date End Date Taking? Authorizing Provider  ciprofloxacin (CIPRO) 500 MG tablet Take 1 tablet (500 mg total) 2 (two) times daily by mouth. 03/05/17   Hayden RasmussenMabe, Quinnten Calvin, NP  indomethacin (INDOCIN) 25 MG capsule Take 25 mg by mouth 3 (three) times daily as needed for mild pain.    [provider]  polyethylene glycol powder (GLYCOLAX/MIRALAX) powder Take 17 gm powder in 6 oz liquid every 30 minutes x 3. Repeat in 6 hours if no results. 03/05/17   Hayden RasmussenMabe, Sylvan Lahm, NP  tamsulosin (FLOMAX) 0.4 MG CAPS capsule Take 1 capsule (0.4 mg total) at bedtime by mouth. 03/05/17   Hayden RasmussenMabe, Javis Abboud, NP    Family History History reviewed. No pertinent family history.  Social History Social History   Tobacco Use  . Smoking status: Never Smoker  . Smokeless tobacco: Never Used  Substance Use Topics  . Alcohol use: Yes    Comment: weekly  . Drug use: No     Allergies   Patient has no known allergies.   Review of Systems Review of Systems  Constitutional: Negative for activity change and fever.  HENT: Negative.   Respiratory: Negative.   Gastrointestinal: Positive for abdominal pain, constipation and  nausea. Negative for blood in stool, diarrhea and vomiting.  Genitourinary: Positive for difficulty urinating and hematuria.  All other systems reviewed and are negative.    Physical Exam Triage Vital Signs ED Triage Vitals [03/05/17 1836]  Enc Vitals Group     BP 138/89     Pulse Rate 66     Resp 18     Temp 97.8 F (36.6 C)     Temp Source Oral     SpO2 100 %     Weight      Height      Head Circumference      Peak Flow      Pain Score      Pain Loc      Pain Edu?      Excl. in GC?    No data found.  Updated Vital Signs BP 138/89 (BP Location: Right Arm)   Pulse  66   Temp 97.8 F (36.6 C) (Oral)   Resp 18   SpO2 100%   Visual Acuity Right Eye Distance:   Left Eye Distance:   Bilateral Distance:    Right Eye Near:   Left Eye Near:    Bilateral Near:     Physical Exam  Constitutional: He appears well-developed and well-nourished.  Non-toxic appearance. He does not appear ill.  Eyes: EOM are normal.  Cardiovascular: Regular rhythm.  Pulmonary/Chest: Breath sounds normal.  Abdominal: Normal appearance and bowel sounds are normal. He exhibits no ascites, no pulsatile midline mass and no mass.  Abdomen percusses tympanic in most areas above the navel. Some tenderness in the right hemiabdomen. No rebound or guarding. Tenderness to the left hemiabdomen. Percussion is dull  across the lower abdomen.  Neurological: He is alert.  Nursing note and vitals reviewed.    UC Treatments / Results  Labs (all labs ordered are listed, but only abnormal results are displayed) Labs Reviewed - No data to display  EKG  EKG Interpretation None       Radiology No results found.  Procedures Procedures (including critical care time)  Medications Ordered in UC Medications - No data to display   Initial Impression / Assessment and Plan / UC Course  I have reviewed the triage vital signs and the nursing notes.  Pertinent labs & imaging results that were available during my care of the patient were reviewed by me and considered in my medical decision making (see chart for details).    Read these instructions Take the medications as directed. Go to pharmacy to get them Call the stomach specialist for appointment Will also need to see a urologist for the prostate    Final Clinical Impressions(s) / UC Diagnoses   Final diagnoses:  Prostate enlargement  Hepatic steatosis  Slow transit constipation  Generalized abdominal pain    ED Discharge Orders        Ordered    polyethylene glycol powder (GLYCOLAX/MIRALAX) powder     03/05/17 2000      tamsulosin (FLOMAX) 0.4 MG CAPS capsule  Daily at bedtime     03/05/17 2000    ciprofloxacin (CIPRO) 500 MG tablet  2 times daily     03/05/17 2000       Controlled Substance Prescriptions [place] Controlled Substance Registry consulted? Not Applicable   Hayden RasmussenMabe, Shiara Mcgough, NP 03/05/17 2005

## 2017-03-05 NOTE — ED Triage Notes (Signed)
Pt sts lower abd pain and burning with urination x 2 weeks; pt seen here for same recently

## 2017-12-15 ENCOUNTER — Emergency Department (HOSPITAL_COMMUNITY)
Admission: EM | Admit: 2017-12-15 | Discharge: 2017-12-15 | Disposition: A | Discharge status: Home / Self Care | Payer: No Typology Code available for payment source | Attending: Emergency Medicine | Admitting: Emergency Medicine

## 2017-12-15 ENCOUNTER — Emergency Department (HOSPITAL_COMMUNITY): Payer: No Typology Code available for payment source

## 2017-12-15 ENCOUNTER — Encounter (HOSPITAL_COMMUNITY): Payer: Self-pay | Admitting: Emergency Medicine

## 2017-12-15 DIAGNOSIS — R109 Unspecified abdominal pain: Secondary | ICD-10-CM

## 2017-12-15 DIAGNOSIS — Z79899 Other long term (current) drug therapy: Secondary | ICD-10-CM | POA: Diagnosis not present

## 2017-12-15 DIAGNOSIS — R1084 Generalized abdominal pain: Secondary | ICD-10-CM | POA: Diagnosis present

## 2017-12-15 DIAGNOSIS — K5900 Constipation, unspecified: Secondary | ICD-10-CM | POA: Diagnosis not present

## 2017-12-15 LAB — URINALYSIS, ROUTINE W REFLEX MICROSCOPIC
BILIRUBIN URINE: NEGATIVE
Bacteria, UA: NONE SEEN
Glucose, UA: NEGATIVE mg/dL
HGB URINE DIPSTICK: NEGATIVE
KETONES UR: NEGATIVE mg/dL
LEUKOCYTES UA: NEGATIVE
Nitrite: NEGATIVE
Protein, ur: 30 mg/dL — AB
Specific Gravity, Urine: 1.018 (ref 1.005–1.030)
pH: 6 (ref 5.0–8.0)

## 2017-12-15 LAB — COMPREHENSIVE METABOLIC PANEL
ALT: 28 U/L (ref 0–44)
AST: 29 U/L (ref 15–41)
Albumin: 4 g/dL (ref 3.5–5.0)
Alkaline Phosphatase: 57 U/L (ref 38–126)
Anion gap: 7 (ref 5–15)
BUN: 15 mg/dL (ref 6–20)
CHLORIDE: 107 mmol/L (ref 98–111)
CO2: 26 mmol/L (ref 22–32)
CREATININE: 0.78 mg/dL (ref 0.61–1.24)
Calcium: 9.1 mg/dL (ref 8.9–10.3)
GFR calc non Af Amer: >60 mL/min (ref >60)
Glucose, Bld: 148 mg/dL — ABNORMAL HIGH (ref 70–99)
POTASSIUM: 4.1 mmol/L (ref 3.5–5.1)
Sodium: 140 mmol/L (ref 135–145)
Total Bilirubin: 0.8 mg/dL (ref 0.3–1.2)
Total Protein: 6.9 g/dL (ref 6.5–8.1)

## 2017-12-15 LAB — CBC
HCT: 43.2 % (ref 39.0–52.0)
Hemoglobin: 14.5 g/dL (ref 13.0–17.0)
MCH: 31.3 pg (ref 26.0–34.0)
MCHC: 33.6 g/dL (ref 30.0–36.0)
MCV: 93.3 fL (ref 78.0–100.0)
PLATELETS: 195 10*3/uL (ref 150–400)
RBC: 4.63 MIL/uL (ref 4.22–5.81)
RDW: 12 % (ref 11.5–15.5)
WBC: 7.5 10*3/uL (ref 4.0–10.5)

## 2017-12-15 LAB — LIPASE, BLOOD: LIPASE: 42 U/L (ref 11–51)

## 2017-12-15 NOTE — ED Provider Notes (Signed)
[facility] EMERGENCY DEPARTMENT Provider Note   CSN: [id number] Arrival date & time: 12/15/17  1730     History   Chief Complaint Chief Complaint  Patient presents with  . Abdominal Pain    HPI Menelik Mcfarren is a 57 y.o. male.  Patient c/o abdominal cramping/pain for past 2 weeks, pain crampy, comes and goes, mild/mod, non radiating. No associated vomiting. Had bm today, but periods of constipation in past. No fevers. No scrotal or testicular pain.  The history is provided by the patient. A language interpreter was used.  Abdominal Pain   Pertinent negatives include fever and headaches.    History reviewed. No pertinent past medical history.  There are no active problems to display for this patient.   Past Surgical History:  Procedure Laterality Date  . arm surgery    . EYE MUSCLE SURGERY          Home Medications    Prior to Admission medications   Medication Sig Start Date End Date Taking? Authorizing Provider  ciprofloxacin (CIPRO) 500 MG tablet Take 1 tablet (500 mg total) 2 (two) times daily by mouth. 03/05/17   Hayden Rasmussen, NP  indomethacin (INDOCIN) 25 MG capsule Take 25 mg by mouth 3 (three) times daily as needed for mild pain.    [provider]  polyethylene glycol powder (GLYCOLAX/MIRALAX) powder Take 17 gm powder in 6 oz liquid every 30 minutes x 3. Repeat in 6 hours if no results. 03/05/17   Hayden Rasmussen, NP  tamsulosin (FLOMAX) 0.4 MG CAPS capsule Take 1 capsule (0.4 mg total) at bedtime by mouth. 03/05/17   Hayden Rasmussen, NP    Family History History reviewed. No pertinent family history.  Social History Social History   Tobacco Use  . Smoking status: Never Smoker  . Smokeless tobacco: Never Used  Substance Use Topics  . Alcohol use: Yes    Comment: weekly  . Drug use: No     Allergies   Patient has no known allergies.   Review of Systems Review of Systems  Constitutional: Negative for fever.    HENT: Negative for sore throat.   Eyes: Negative for redness.  Respiratory: Negative for shortness of breath.   Cardiovascular: Negative for chest pain.  Gastrointestinal: Positive for abdominal pain.  Genitourinary: Negative for flank pain.  Musculoskeletal: Positive for back pain.  Skin: Negative for rash.  Neurological: Negative for headaches.  Hematological:       No anticoag use.   Psychiatric/Behavioral: Negative for confusion.     Physical Exam Updated Vital Signs BP (!) 149/96 (BP Location: Right Arm)   Pulse 66   Temp 97.7 F (36.5 C) (Oral)   Resp 16   SpO2 100%   Physical Exam  Constitutional: He appears well-developed and well-nourished.  HENT:  Mouth/Throat: Oropharynx is clear and moist.  Eyes: Conjunctivae are normal. No scleral icterus.  Neck: Neck supple. No tracheal deviation present.  Cardiovascular: Normal rate, regular rhythm, normal heart sounds and intact distal pulses. Exam reveals no gallop and no friction rub.  No murmur heard. Pulmonary/Chest: Effort normal and breath sounds normal. No accessory muscle usage. No respiratory distress.  Abdominal: Soft. Bowel sounds are normal. He exhibits no distension and no mass. There is no tenderness. There is no rebound and no guarding.  No incarcerated hernia.   Genitourinary:  Genitourinary Comments: No cva tenderness.   Musculoskeletal: He exhibits no edema.  Neurological: He is alert.  Skin: Skin is warm and  dry. No rash noted.  Psychiatric: He has a normal mood and affect.  Nursing note and vitals reviewed.    ED Treatments / Results  Labs (all labs ordered are listed, but only abnormal results are displayed) Results for orders placed or performed during the hospital encounter of 12/15/17  Lipase, blood  Result Value Ref Range   Lipase 42 11 - 51 U/L  Comprehensive metabolic panel  Result Value Ref Range   Sodium 140 135 - 145 mmol/L   Potassium 4.1 3.5 - 5.1 mmol/L   Chloride 107 98 - 111  mmol/L   CO2 26 22 - 32 mmol/L   Glucose, Bld 148 (H) 70 - 99 mg/dL   BUN 15 6 - 20 mg/dL   Creatinine, Ser 1.610.78 0.61 - 1.24 mg/dL   Calcium 9.1 8.9 - 09.610.3 mg/dL   Total Protein 6.9 6.5 - 8.1 g/dL   Albumin 4.0 3.5 - 5.0 g/dL   AST 29 15 - 41 U/L   ALT 28 0 - 44 U/L   Alkaline Phosphatase 57 38 - 126 U/L   Total Bilirubin 0.8 0.3 - 1.2 mg/dL   GFR calc non Af Amer >60 >60 mL/min   GFR calc Af Amer >60 >60 mL/min   Anion gap 7 5 - 15  CBC  Result Value Ref Range   WBC 7.5 4.0 - 10.5 K/uL   RBC 4.63 4.22 - 5.81 MIL/uL   Hemoglobin 14.5 13.0 - 17.0 g/dL   HCT 04.543.2 40.939.0 - 81.152.0 %   MCV 93.3 78.0 - 100.0 fL   MCH 31.3 26.0 - 34.0 pg   MCHC 33.6 30.0 - 36.0 g/dL   RDW 91.412.0 78.211.5 - 95.615.5 %   Platelets 195 150 - 400 K/uL  Urinalysis, Routine w reflex microscopic  Result Value Ref Range   Color, Urine YELLOW YELLOW   APPearance CLEAR CLEAR   Specific Gravity, Urine 1.018 1.005 - 1.030   pH 6.0 5.0 - 8.0   Glucose, UA NEGATIVE NEGATIVE mg/dL   Hgb urine dipstick NEGATIVE NEGATIVE   Bilirubin Urine NEGATIVE NEGATIVE   Ketones, ur NEGATIVE NEGATIVE mg/dL   Protein, ur 30 (A) NEGATIVE mg/dL   Nitrite NEGATIVE NEGATIVE   Leukocytes, UA NEGATIVE NEGATIVE   RBC / HPF 0-5 0 - 5 RBC/hpf   Bacteria, UA NONE SEEN NONE SEEN   Mucus PRESENT    EKG None  Radiology No results found.  Procedures Procedures (including critical care time)  Medications Ordered in ED Medications - No data to display   Initial Impression / Assessment and Plan / ED Course  I have reviewed the triage vital signs and the nursing notes.  Pertinent labs & imaging results that were available during my care of the patient were reviewed by me and considered in my medical decision making (see chart for details).  History done, and relayed results to pt via translator line/walle.   Labs sent.   Reviewed nursing notes and prior charts for additional history.  Pt with several prior cts to eval for abd pain in  past - neg for acute process then.   Labs reviewed - chem normal, wbc normal.  xrays reviewed - no sbo.   Recheck abd soft nt. Afebrile.   Patient currently appears stable for d/c.     Final Clinical Impressions(s) / ED Diagnoses   Final diagnoses:  None    ED Discharge Orders    None       Cathren LaineSteinl, Dyneisha Murchison, MD 12/15/17  2042  

## 2017-12-15 NOTE — ED Triage Notes (Signed)
Pt presents to ED for assessment of generalized abdominal pain, swelling, and constipation x 2 weeks.  Pt states he was told he had some liver issues the last time he was here.  Denies changes in urination.  Denies vomiting, c/o intermittent nausea.

## 2017-12-15 NOTE — ED Notes (Signed)
Interpreter used for discharge, pt voiced understanding.

## 2017-12-15 NOTE — Discharge Instructions (Signed)
It was our pleasure to provide your ER care today - we hope that you feel better.  If constipated, drink plenty of fluids/water, get adequate amount of fiber in diet, and take colace (stool softener) 2x/day, and miralax (laxative) one time a day as needed.    Follow up with primary care doctor in 1-2 weeks - see referral - call office to arrange appointment.   Return to ER if worse, new symptoms, fevers, worsening or severe abdominal pain, persistent vomiting, other concern.

## 2018-03-05 IMAGING — CT CT ABD-PELV W/ CM
2 of 5 series · 16 of 46 positions shown, 18 images · IV contrast (APPLIED)
Comparison: 02/22/2016

CLINICAL DATA: Abdominal pain and back pain for over a year.
Burning with urination.

EXAM:
CT ABDOMEN AND PELVIS WITH CONTRAST
TECHNIQUE: Multidetector CT imaging of the abdomen and pelvis was performed
using the standard protocol following bolus administration of
intravenous contrast.
CONTRAST:  100mL SOUFUX-8YY IOPAMIDOL (SOUFUX-8YY) INJECTION 61%

[Series 3: abdomen 5.0 · axial · 0.70mm/px · z∈[+650,+1060]mm · 13 of 96 slices shown, 15 images]
[im 7/96  soft-tissue]
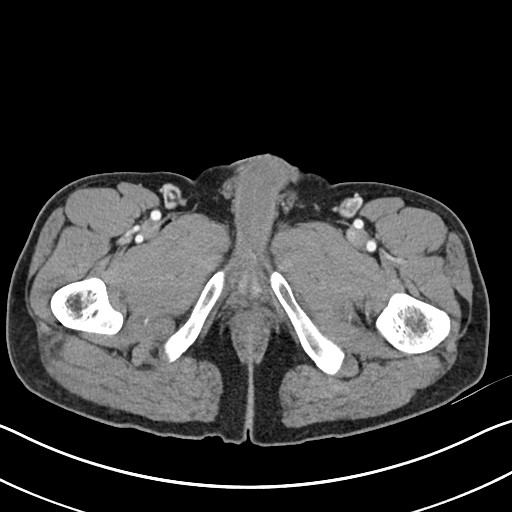
[im 7/96  bone]
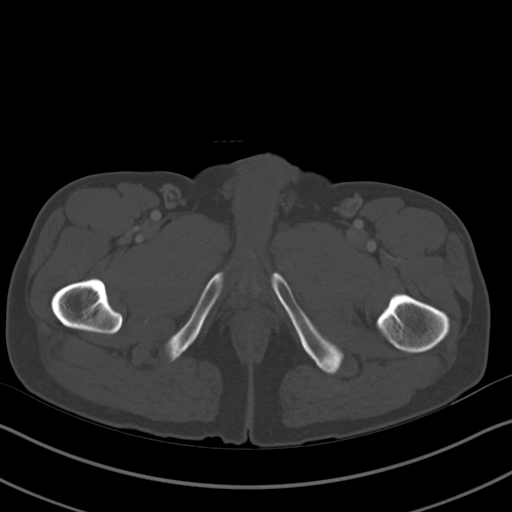
[im 14/96  soft-tissue]
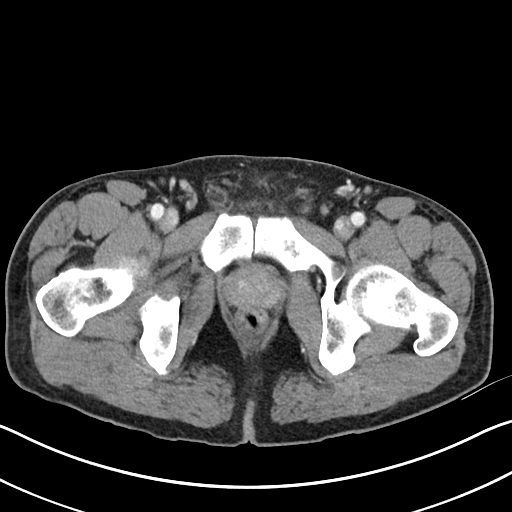
[im 21/96  soft-tissue]
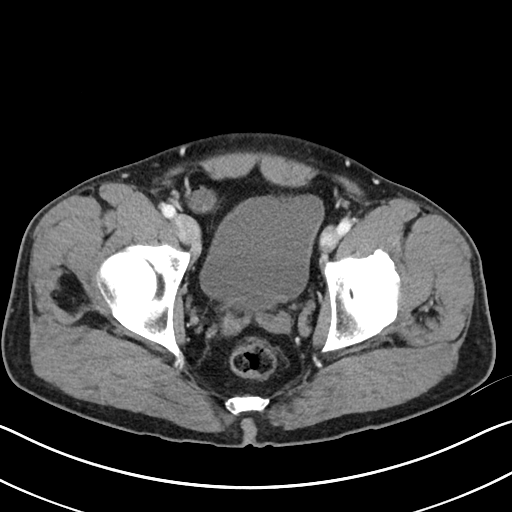
[im 28/96  soft-tissue]
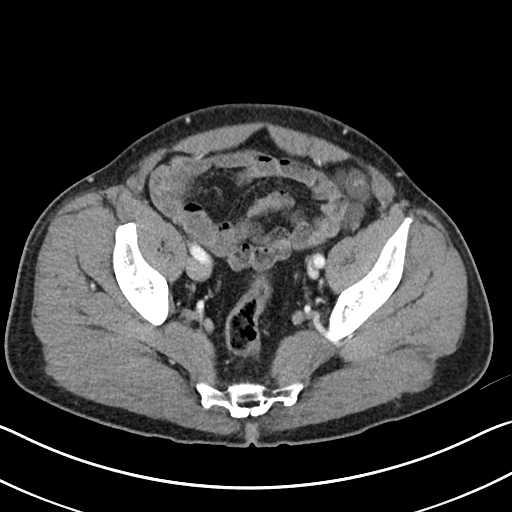
[im 34/96  soft-tissue]
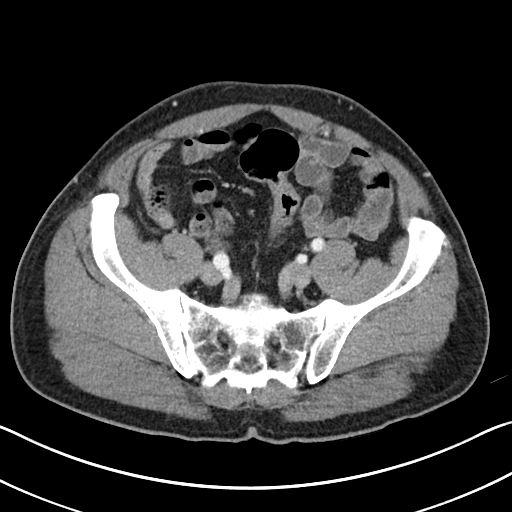
[im 41/96  soft-tissue]
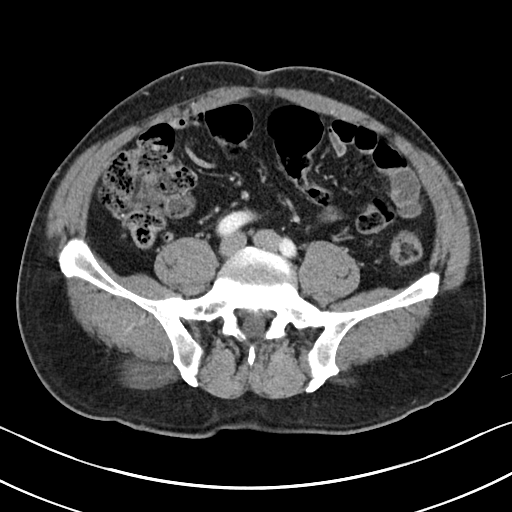
[im 48/96  soft-tissue]
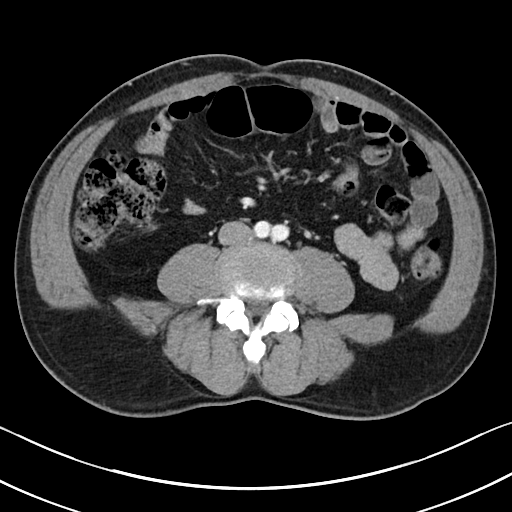
[im 55/96  soft-tissue]
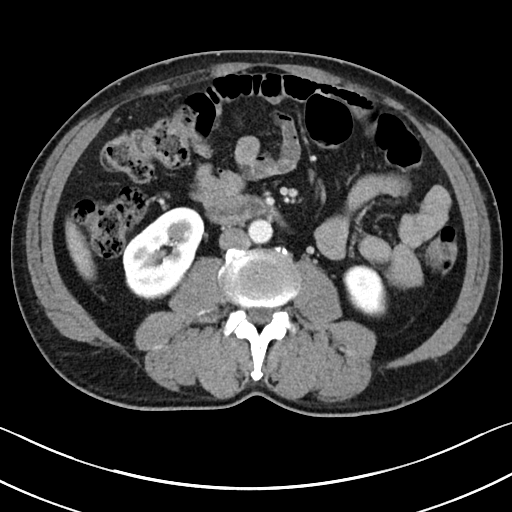
[im 62/96  soft-tissue]
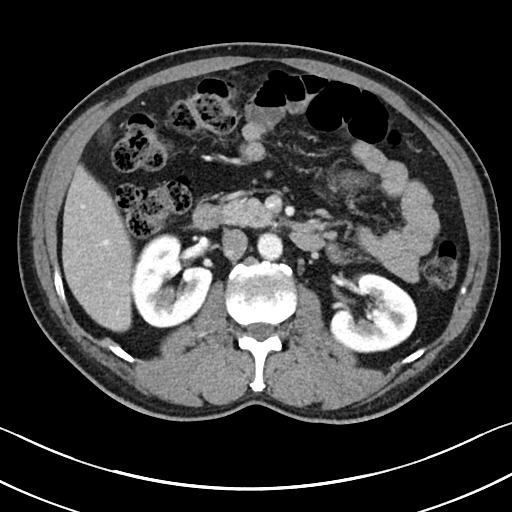
[im 62/96  bone]
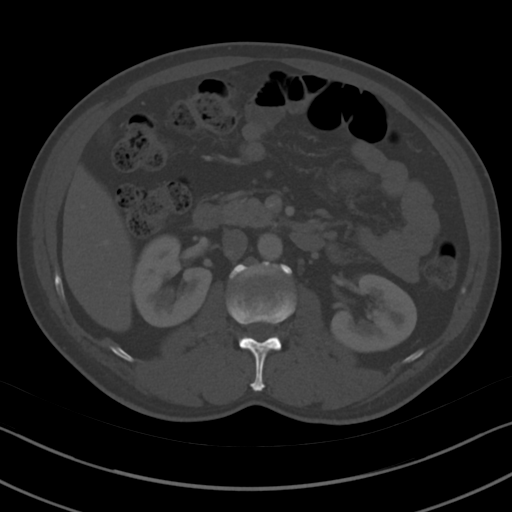
[im 68/96  soft-tissue]
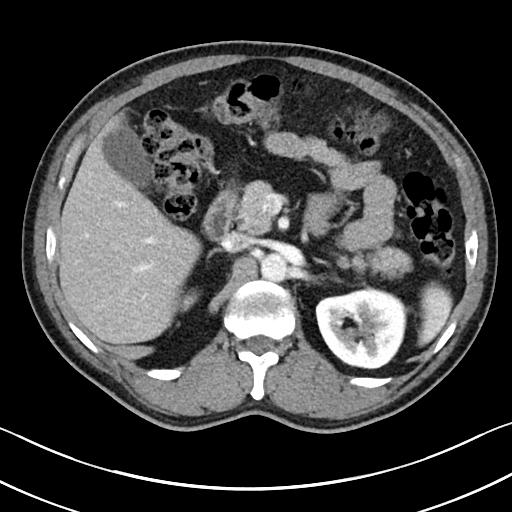
[im 75/96  soft-tissue]
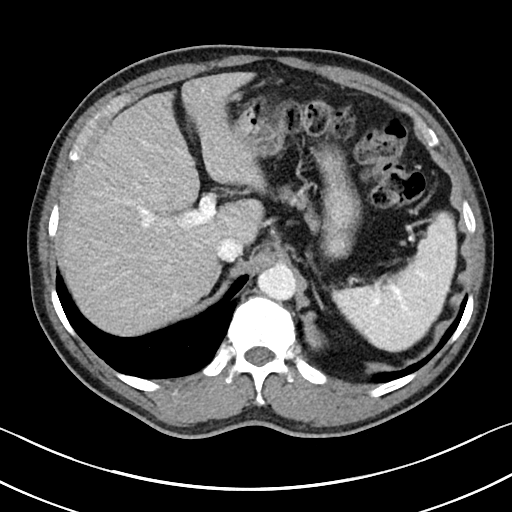
[im 82/96  soft-tissue]
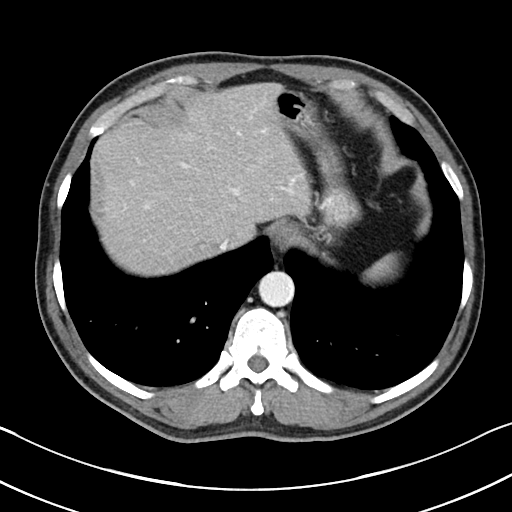
[im 89/96  soft-tissue]
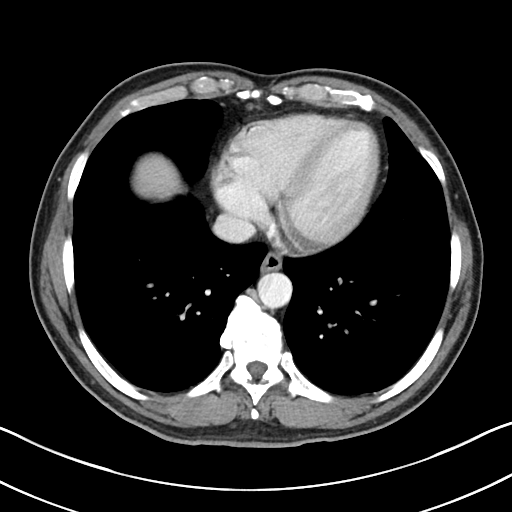

[Series 6: abdomen 3.0 mpr cor · coronal · 0.72mm/px · 3 of 97 slices shown]
[im 33/97  soft-tissue]
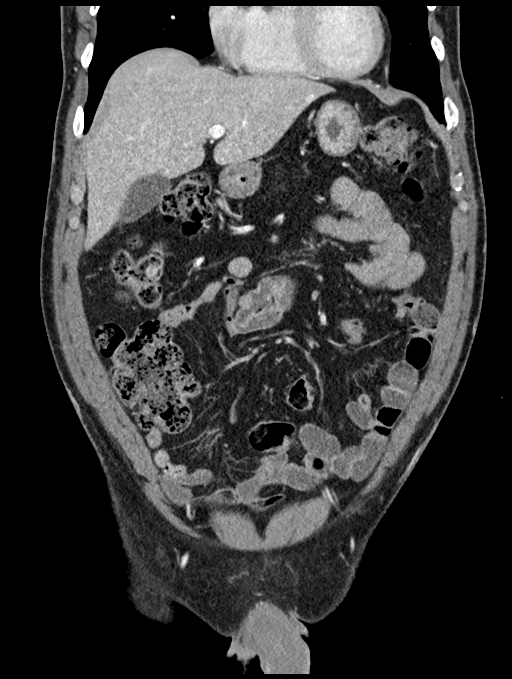
[im 43/97  soft-tissue]
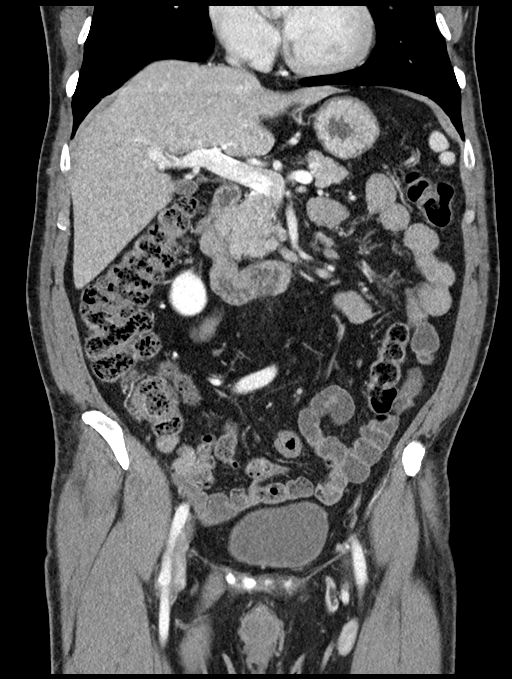
[im 54/97  soft-tissue]
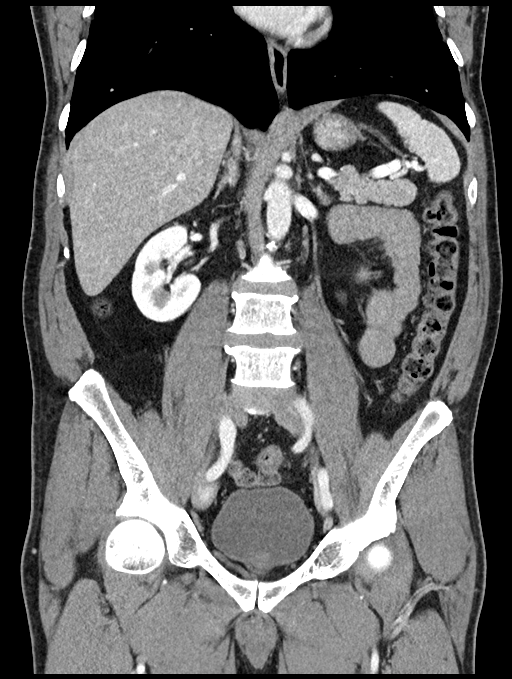

[16 of 46 positions shown; findings below may reference images not displayed]

FINDINGS: Lower chest: The lung bases are clear.

Hepatobiliary: Mild diffuse fatty infiltration of the liver. No
focal lesions identified. Gallbladder and bile ducts are
unremarkable.

Pancreas: Unremarkable. No pancreatic ductal dilatation or
surrounding inflammatory changes.

Spleen: Normal in size without focal abnormality.

Adrenals/Urinary Tract: Adrenal glands are unremarkable. Kidneys are
normal, without renal calculi, focal lesion, or hydronephrosis.
Bladder is unremarkable.

Stomach/Bowel: Stomach is within normal limits. Appendix appears
normal. No evidence of bowel wall thickening, distention, or
inflammatory changes.

Vascular/Lymphatic: No significant vascular findings are present. No
enlarged abdominal or pelvic lymph nodes.

Reproductive: Prostate gland is mildly enlarged, measuring 4.3 cm
diameter. Prostate calcifications.

Other: Minimal periumbilical hernia containing fat. No free air or
free fluid in the abdomen.

Musculoskeletal: Degenerative changes in the spine. No destructive
bone lesions.
IMPRESSION: 1. Diffuse fatty infiltration of the liver.
2. Enlarged prostate gland.
3. Minimal periumbilical hernia containing fat.
4. No bowel obstruction or inflammation.

## 2023-08-23 ENCOUNTER — Encounter: Payer: Self-pay | Admitting: Gastroenterology
